# Patient Record
Sex: Male | Born: 1970 | Race: White | Hispanic: No | Smoking: Never smoker
Health system: Southern US, Community
[De-identification: ages and names within clinical notes are randomized; demographics above are authoritative.]

## PROBLEM LIST (undated history)

## (undated) DIAGNOSIS — I10 Essential (primary) hypertension: Secondary | ICD-10-CM

---

## 2020-05-10 ENCOUNTER — Emergency Department (HOSPITAL_COMMUNITY)
Admission: EM | Admit: 2020-05-10 | Discharge: 2020-05-11 | Discharge status: Against Medical Advice | Payer: No Typology Code available for payment source | Attending: Emergency Medicine | Admitting: Emergency Medicine

## 2020-05-10 ENCOUNTER — Emergency Department (HOSPITAL_COMMUNITY): Payer: No Typology Code available for payment source

## 2020-05-10 ENCOUNTER — Other Ambulatory Visit: Payer: Self-pay

## 2020-05-10 DIAGNOSIS — S60922A Unspecified superficial injury of left hand, initial encounter: Secondary | ICD-10-CM | POA: Insufficient documentation

## 2020-05-10 DIAGNOSIS — X58XXXA Exposure to other specified factors, initial encounter: Secondary | ICD-10-CM | POA: Insufficient documentation

## 2020-05-10 DIAGNOSIS — Y999 Unspecified external cause status: Secondary | ICD-10-CM | POA: Diagnosis not present

## 2020-05-10 DIAGNOSIS — Y939 Activity, unspecified: Secondary | ICD-10-CM | POA: Diagnosis not present

## 2020-05-10 DIAGNOSIS — Y929 Unspecified place or not applicable: Secondary | ICD-10-CM | POA: Diagnosis not present

## 2020-05-10 DIAGNOSIS — Z5321 Procedure and treatment not carried out due to patient leaving prior to being seen by health care provider: Secondary | ICD-10-CM | POA: Insufficient documentation

## 2020-05-10 NOTE — ED Triage Notes (Signed)
C/O hand injury on left side. Laceration from metal; unknown last tetanus shot,

## 2020-05-11 NOTE — ED Notes (Signed)
Pt called x 3 with no answer

## 2021-03-26 IMAGING — CR DG HAND 2V*L*
2 series · 2 of 2 positions shown · non-contrast
Comparison: None.

CLINICAL DATA: Fifth left digit laceration.

EXAM:
LEFT HAND - 2 VIEW

[hand pa]
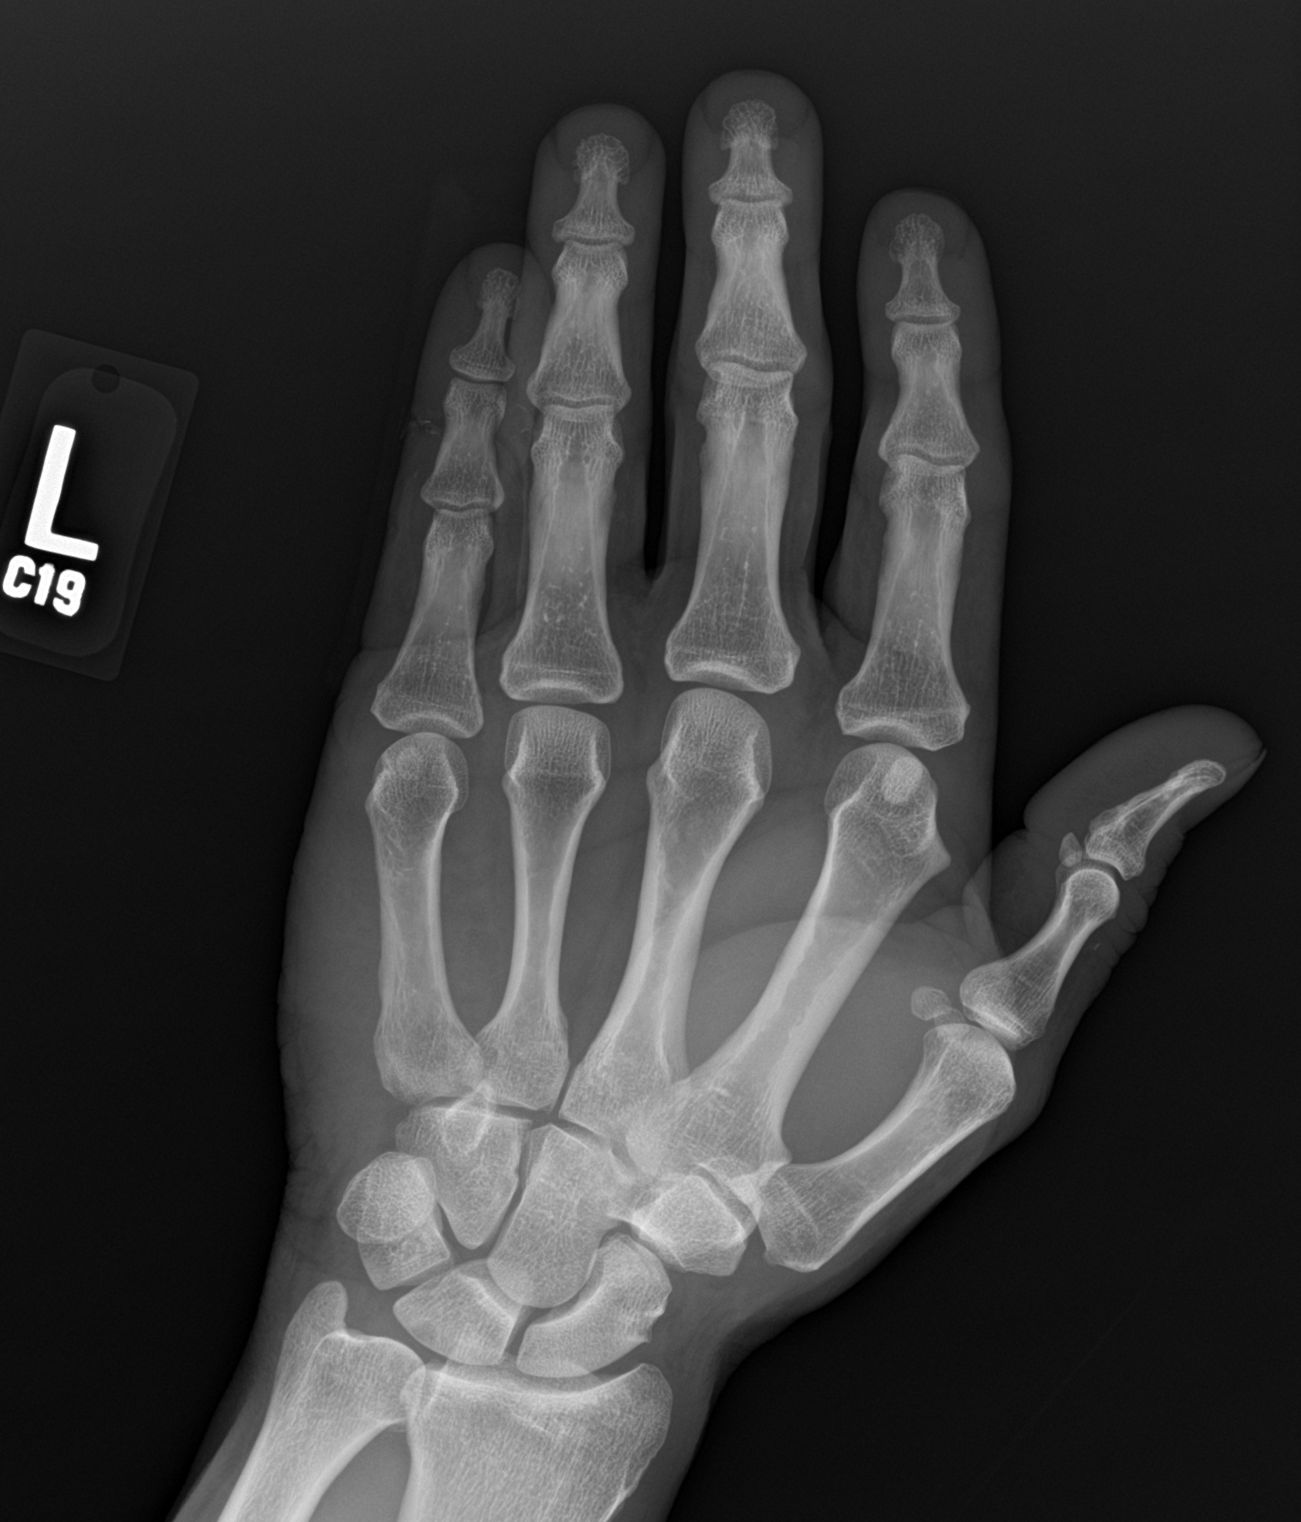

[hand lat]
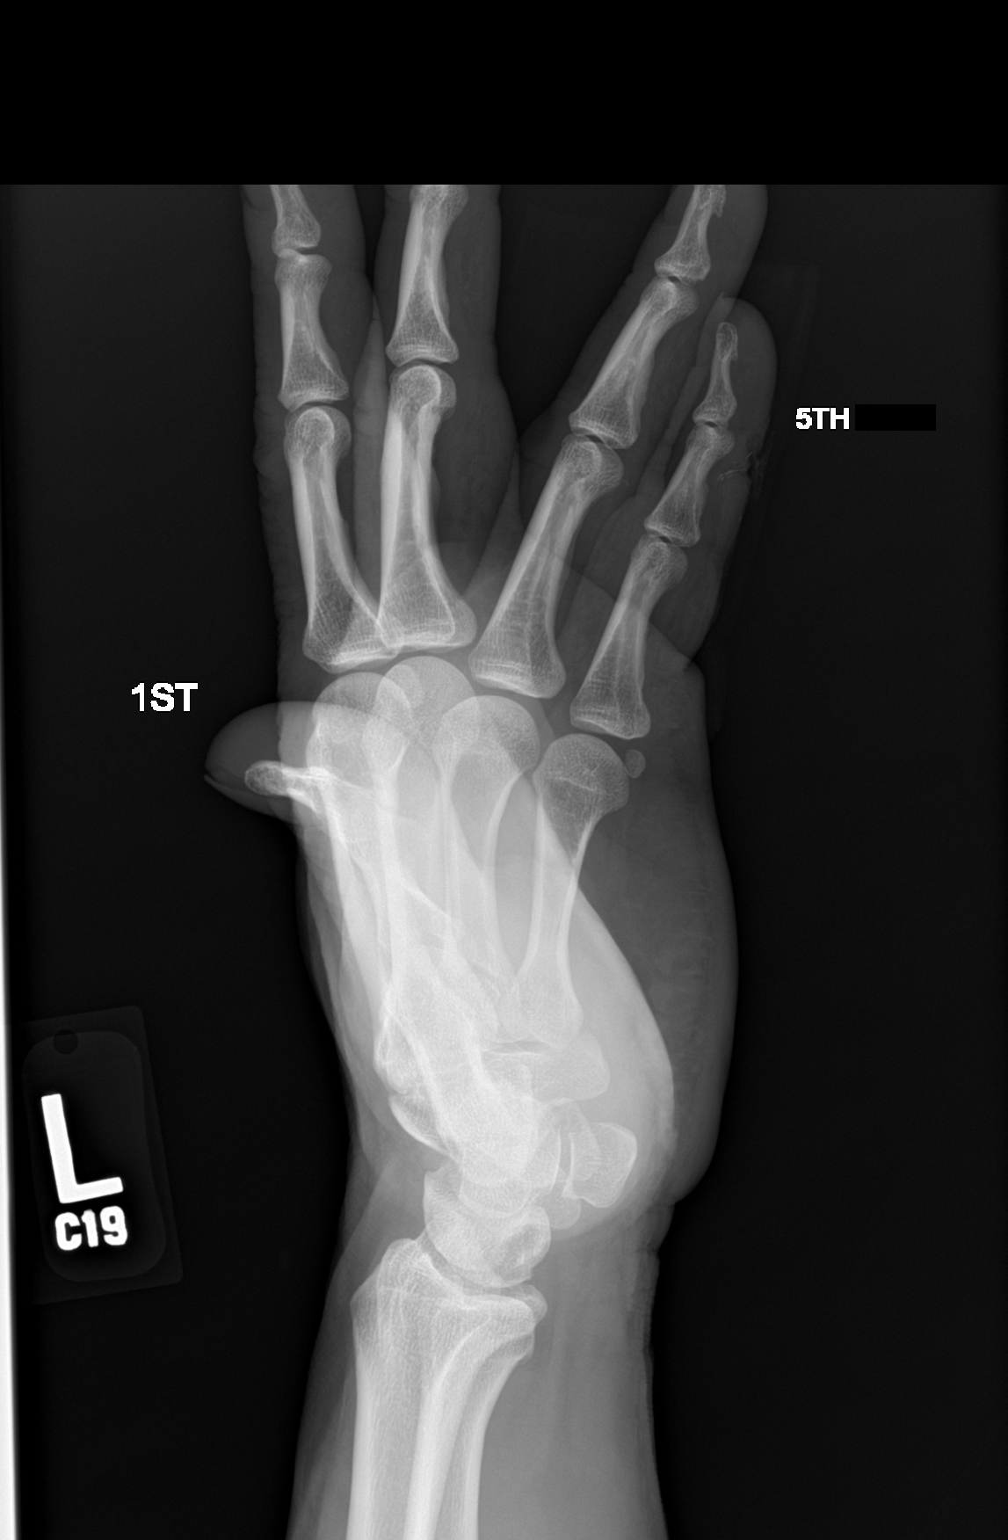

[2 of 2 positions shown; findings below may reference images not displayed]

FINDINGS: There is no evidence of fracture or dislocation. There is no
evidence of arthropathy or other focal bone abnormality. A thin
superficial linear soft tissue defect is seen along the lateral
aspect of the fifth left finger. This is adjacent to the middle
phalanx. Thin, linear, 1 mm and 2 mm radiopaque foci are seen within
this region.
IMPRESSION: 1. Fifth left finger soft tissue laceration with associated tiny
radiopaque foreign bodies.
2. No acute osseous abnormality.

## 2023-12-16 ENCOUNTER — Emergency Department (HOSPITAL_COMMUNITY)
Admission: EM | Admit: 2023-12-16 | Discharge: 2023-12-16 | Discharge status: Against Medical Advice | Payer: Self-pay | Attending: Emergency Medicine | Admitting: Emergency Medicine

## 2023-12-16 ENCOUNTER — Encounter (HOSPITAL_COMMUNITY): Payer: Self-pay | Admitting: Emergency Medicine

## 2023-12-16 ENCOUNTER — Other Ambulatory Visit: Payer: Self-pay

## 2023-12-16 DIAGNOSIS — Z5321 Procedure and treatment not carried out due to patient leaving prior to being seen by health care provider: Secondary | ICD-10-CM | POA: Insufficient documentation

## 2023-12-16 DIAGNOSIS — R410 Disorientation, unspecified: Secondary | ICD-10-CM | POA: Insufficient documentation

## 2023-12-16 DIAGNOSIS — R7309 Other abnormal glucose: Secondary | ICD-10-CM | POA: Insufficient documentation

## 2023-12-16 HISTORY — DX: Essential (primary) hypertension: I10

## 2023-12-16 LAB — CBG MONITORING, ED: Glucose-Capillary: 102 mg/dL — ABNORMAL HIGH (ref 70–99)

## 2023-12-16 NOTE — ED Notes (Addendum)
 Pt approached RN in hallway and said he was leaving. Nurse inquired on request to leave early. Pt says "I think I had a bad day and just want to go". Encouraged to remain and finish medical treatment but declined. Encouraged to return with recurrent or worsening symptoms.   Alert, oriented and ambulatory. Displays no signs of distress. Unable to obtain AMA signature.

## 2023-12-16 NOTE — ED Provider Notes (Signed)
 Patient brought in by EMS confused. Was triaged in the EMS triage bay and brought back to a hospital bed as soon as it was available. When I went to evaluate patient after he had been bedded in his room, the room was found to be empty.  He had eloped from the emergency department per conversation with his nurse.   Glyn Ade, MD 12/16/23 2103

## 2023-12-16 NOTE — ED Triage Notes (Signed)
 Pt BIB EMS from work after having a syncopal episode while on the phone. Pt has a hematoma to the back of the head from falling. Upon ems arrival, pt was slightly confused and lethargic but is back to baseline and ambulatory upon arrival here. Pt states that he feels intoxicated and coworkers stated they smelt alcohol on him, pt denies drinking while at work. Pt has been using his inhaler. Pt recently dx with parkinson's and started on carbidopa and levodopa. Recently stopped taking his diclofenac and gabapentin.   164/108 90HR 16RR 94RA Cbg 160
